# Patient Record
Sex: Male | Born: 1955 | Race: Black or African American | Hispanic: No | Smoking: Never smoker
Health system: Southern US, Community
[De-identification: ages and names within clinical notes are randomized; demographics above are authoritative.]

## PROBLEM LIST (undated history)

## (undated) DIAGNOSIS — I10 Essential (primary) hypertension: Secondary | ICD-10-CM

## (undated) DIAGNOSIS — E119 Type 2 diabetes mellitus without complications: Secondary | ICD-10-CM

---

## 2016-02-09 ENCOUNTER — Other Ambulatory Visit: Payer: Self-pay

## 2016-02-09 ENCOUNTER — Ambulatory Visit (INDEPENDENT_AMBULATORY_CARE_PROVIDER_SITE_OTHER): Payer: BLUE CROSS/BLUE SHIELD | Admitting: Podiatry

## 2016-02-09 ENCOUNTER — Inpatient Hospital Stay: Admission: RE | Admit: 2016-02-09 | Payer: Self-pay | Source: Ambulatory Visit

## 2016-02-09 ENCOUNTER — Encounter: Payer: Self-pay | Admitting: Podiatry

## 2016-02-09 VITALS — BP 138/100 | HR 68 | Ht 74.0 in | Wt 232.0 lb

## 2016-02-09 DIAGNOSIS — M216X2 Other acquired deformities of left foot: Principal | ICD-10-CM

## 2016-02-09 DIAGNOSIS — M216X9 Other acquired deformities of unspecified foot: Secondary | ICD-10-CM

## 2016-02-09 DIAGNOSIS — M216X1 Other acquired deformities of right foot: Secondary | ICD-10-CM | POA: Insufficient documentation

## 2016-02-09 DIAGNOSIS — M21969 Unspecified acquired deformity of unspecified lower leg: Secondary | ICD-10-CM | POA: Diagnosis not present

## 2016-02-09 NOTE — Patient Instructions (Signed)
Seen for custom orthotics prepared. Noted of short and elevated first metatarsal bone. Orthotics are the first line of choice for this condition. Will call when orthotics are ready.

## 2016-02-09 NOTE — Progress Notes (Signed)
SUBJECTIVE: 60 y.o. year old male presents requesting shoe inserts. Been wearing two pairs of socks, feet are affecting knees.   REVIEW OF SYSTEMS: A comprehensive review of systems was negative.  OBJECTIVE: DERMATOLOGIC EXAMINATION: No abnormal findings.  VASCULAR EXAMINATION OF LOWER LIMBS: Pedal pulses: All pedal pulses are palpable with normal pulsation.  Temperature gradient from tibial crest to dorsum of foot is within normal bilateral.  NEUROLOGIC EXAMINATION OF THE LOWER LIMBS: Achilles DTR is present and within normal. Monofilament (Semmes-Weinstein 10-gm) sensory testing positive 6 out of 6, bilateral. Vibratory sensations(128Hz  turning fork) intact at medial and lateral forefoot bilateral.  Sharp and Dull discriminatory sensations at the plantar ball of hallux is intact bilateral.   MUSCULOSKELETAL EXAMINATION: Positive for forefoot varus, elevated first ray, abducted forefoot bilateral.   RADIOGRAPHIC STUDIES:  AP View:  Short first metatarsal bone bilateral. Increased sclerotic changes with flattened first metatarsal head bilateral.  Abducted midtarsal joints bilateral.  Lateral view:  Pronated/ foot with midtarsal sagging  Severe elevated first ray L>R.  Decreased calcaneal inclination angle bilateral: Talar head plantar advancement bilateral.: Anterior break in CYMA line bilateral.  ASSESSMENT: Forefoot varus bilateral. Hypermobile first ray bilateral. Short first ray bilateral. STJ hyperpronation bilateral.  PLAN: Reviewed clinical findings, x-ray findings, and available treatment options. Both feet casted for Orthotics.

## 2016-04-13 ENCOUNTER — Ambulatory Visit: Payer: BLUE CROSS/BLUE SHIELD | Admitting: Podiatry

## 2016-04-26 ENCOUNTER — Encounter: Payer: Self-pay | Admitting: Podiatry

## 2016-04-26 ENCOUNTER — Ambulatory Visit (INDEPENDENT_AMBULATORY_CARE_PROVIDER_SITE_OTHER): Payer: BLUE CROSS/BLUE SHIELD | Admitting: Podiatry

## 2016-04-26 DIAGNOSIS — M21969 Unspecified acquired deformity of unspecified lower leg: Secondary | ICD-10-CM

## 2016-04-26 DIAGNOSIS — M216X1 Other acquired deformities of right foot: Secondary | ICD-10-CM

## 2016-04-26 DIAGNOSIS — M216X2 Other acquired deformities of left foot: Secondary | ICD-10-CM

## 2016-04-26 NOTE — Progress Notes (Signed)
1 month orthotic check. On feet long hours. Orthotics are doing well.  Reviewed proper shoe gear.  Will return for 2nd pair orthotics.

## 2016-04-26 NOTE — Patient Instructions (Signed)
Orthotic check up. Doing well. Reviewed proper shoe selection.  Return as needed or for 2nd pair orthotics.

## 2016-09-19 ENCOUNTER — Emergency Department (HOSPITAL_BASED_OUTPATIENT_CLINIC_OR_DEPARTMENT_OTHER): Payer: BLUE CROSS/BLUE SHIELD

## 2016-09-19 ENCOUNTER — Emergency Department (HOSPITAL_BASED_OUTPATIENT_CLINIC_OR_DEPARTMENT_OTHER)
Admission: EM | Admit: 2016-09-19 | Discharge: 2016-09-20 | Disposition: A | Discharge status: Home / Self Care | Payer: BLUE CROSS/BLUE SHIELD | Attending: Emergency Medicine | Admitting: Emergency Medicine

## 2016-09-19 ENCOUNTER — Encounter (HOSPITAL_BASED_OUTPATIENT_CLINIC_OR_DEPARTMENT_OTHER): Payer: Self-pay | Admitting: *Deleted

## 2016-09-19 DIAGNOSIS — J181 Lobar pneumonia, unspecified organism: Secondary | ICD-10-CM | POA: Insufficient documentation

## 2016-09-19 DIAGNOSIS — J189 Pneumonia, unspecified organism: Secondary | ICD-10-CM

## 2016-09-19 DIAGNOSIS — R05 Cough: Secondary | ICD-10-CM | POA: Diagnosis present

## 2016-09-19 LAB — CBG MONITORING, ED: GLUCOSE-CAPILLARY: 102 mg/dL — AB (ref 65–99)

## 2016-09-19 NOTE — ED Provider Notes (Signed)
[facility]   CSN: [id number] Arrival date & time: 09/19/16  2208   By signing my name below, I, Nelwyn SalisburyJoshua Fowler, attest that this documentation has been prepared under the direction and in the presence of Glynn OctaveStephen Samaad Hashem, MD . Electronically Signed: Nelwyn SalisburyJoshua Fowler, Scribe. 09/19/2016. 11:01 PM.  History   Chief Complaint Chief Complaint  Patient presents with  . URI   The history is provided by the patient. No language interpreter was used.    HPI Comments:  Cody Evans is a 61 y.o. male with pmhx of DM and HTN who presents to the Emergency Department complaining of unchanged, frequent, productive cough onset 1 week. No modifying factors indicated. Pt reports associated fatigue, rhinorrhea, and nasal congestion. He has tried tumeric and garlic at home for his symptoms with no relief. Pt denies any fever, vomiting, diarrhea or chills. No chest pain or abdominal pain. His p/o intake has been normal. Pt states that he has an extensive amount of sick contacts including family and friends. He is non-compliant with his prescribed metformin.   History reviewed. No pertinent past medical history.  Patient Active Problem List   Diagnosis Date Noted  . 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 No pertinent surgical history.     Home Medications    Prior to Admission medications   Medication Sig Start Date End Date Taking? Authorizing Provider  brimonidine (ALPHAGAN) 0.2 % ophthalmic solution  01/19/16   Historical Provider, MD  Doxepin HCl 5 % CREA  12/29/15   Historical Provider, MD  latanoprost (XALATAN) 0.005 % ophthalmic solution  12/26/15   Historical Provider, MD  lidocaine (XYLOCAINE) 5 % ointment  01/31/16   Historical Provider, MD  losartan-hydrochlorothiazide Mauri Reading(HYZAAR) 100-25 MG tablet  01/14/16   Historical Provider, MD    Family History History reviewed. No pertinent family history.  Social History Social History  Substance  Use Topics  . Smoking status: Never Smoker  . Smokeless tobacco: Never Used  . Alcohol use No     Allergies   Patient has no known allergies.   Review of Systems Review of Systems  Constitutional: Positive for fatigue. Negative for chills and fever.  HENT: Positive for congestion and rhinorrhea.   Respiratory: Positive for cough.   Gastrointestinal: Negative for diarrhea and vomiting.  Neurological: Positive for numbness.  All other systems reviewed and are negative.    Physical Exam Updated Vital Signs BP (!) 134/102 (BP Location: Left Arm) Comment: pt non-compliant with BP meds  Pulse 90   Temp 98 F (36.7 C) (Oral)   Resp 18   Ht 6\' 2"  (1.88 m)   Wt 230 lb (104.3 kg)   SpO2 100%   BMI 29.53 kg/m   Physical Exam  Constitutional: He is oriented to person, place, and time. He appears well-developed and well-nourished. No distress.  HENT:  Head: Normocephalic and atraumatic.  Mouth/Throat: Oropharynx is clear and moist. No oropharyngeal exudate.  Mild frontal sinus tenderness.   Eyes: Conjunctivae and EOM are normal. Pupils are equal, round, and reactive to light.  Neck: Normal range of motion. Neck supple.  No meningismus.  Cardiovascular: Normal rate, regular rhythm, normal heart sounds and intact distal pulses.   No murmur heard. Pulmonary/Chest: Effort normal and breath sounds normal. No respiratory distress. He has no wheezes.  Abdominal: Soft. He exhibits no distension. There is no tenderness. There is no rebound and no guarding.  Musculoskeletal: Normal range of motion. He exhibits  no edema or tenderness.  Neurological: He is alert and oriented to person, place, and time. No cranial nerve deficit. He exhibits normal muscle tone. Coordination normal.   5/5 strength throughout. CN 2-12 intact.Equal grip strength.   Skin: Skin is warm and dry.  Psychiatric: He has a normal mood and affect. His behavior is normal.  Nursing note and vitals reviewed.    ED  Treatments / Results  DIAGNOSTIC STUDIES:  Oxygen Saturation is 100% on RA, normal by my interpretation.    COORDINATION OF CARE:  11:06 PM Discussed treatment plan with pt at bedside which includes blood work and pt agreed to plan.  Labs (all labs ordered are listed, but only abnormal results are displayed) Labs Reviewed - No data to display  EKG  EKG Interpretation None       Radiology Dg Chest 2 View  Result Date: 09/19/2016 CLINICAL DATA:  Cough, fatigue, nasal congestion for 1 week. EXAM: CHEST  2 VIEW COMPARISON:  None. FINDINGS: LEFT lung base consolidation seen on the AP view, possibly within the lower lobe. No pleural effusion. Cardiomediastinal silhouette is normal. Mildly calcified aortic knob. No pneumothorax. Soft tissue planes and included osseous structures are nonsuspicious, mild degenerative change of the thoracic spine. IMPRESSION: LEFT lung base pneumonia. Followup PA and lateral chest X-ray is recommended in 3-4 weeks following trial of antibiotic therapy to ensure resolution and exclude underlying malignancy. Electronically Signed   By: Awilda Metro M.D.   On: 09/19/2016 23:52    Procedures Procedures (including critical care time)  Medications Ordered in ED Medications - No data to display   Initial Impression / Assessment and Plan / ED Course  I have reviewed the triage vital signs and the nursing notes.  Pertinent labs & imaging results that were available during my care of the patient were reviewed by me and considered in my medical decision making (see chart for details).     Noncompliant with hypertensive and diabetes medications. Presents with one-week history of cough and nasal congestion. Denies chest pain. Denies fever. No distress. lungs are clear. CBG 102.  X-ray shows possible left basilar pneumonia. We'll treat. Patient well-appearing and not hypoxic with ambulation. Tolerating by mouth. Discussed with patient he needs x-ray follow-up  to ensure resolution and rule out malignancy. Return precautions discussed.  CURB 65 zero points.     Final Clinical Impressions(s) / ED Diagnoses   Final diagnoses:  Community acquired pneumonia of left lung, unspecified part of lung    New Prescriptions New Prescriptions   No medications on file  I personally performed the services described in this documentation, which was scribed in my presence. The recorded information has been reviewed and is accurate.     Glynn Octave, MD 09/20/16 0120

## 2016-09-19 NOTE — ED Triage Notes (Signed)
Cough and nasal congestion x 1 week.  Denies fever

## 2016-09-20 MED ORDER — BENZONATATE 100 MG PO CAPS: 100.0000 mg | ORAL_CAPSULE | Freq: Three times a day (TID) | ORAL | 0 refills | Status: DC

## 2016-09-20 MED ORDER — DOXYCYCLINE HYCLATE 100 MG PO CAPS: 100.0000 mg | ORAL_CAPSULE | Freq: Two times a day (BID) | ORAL | 0 refills | Status: DC

## 2016-09-20 NOTE — ED Notes (Signed)
Pt ambulated with no difficulties, o2 sat 98%, p-102

## 2016-09-20 NOTE — Discharge Instructions (Signed)
Take the antibiotics as prescribed.  Follow up with your doctor. Return to the ED if you develop new or worsening symptoms. °

## 2018-04-16 IMAGING — DX DG CHEST 2V
2 series · 2 of 2 positions shown · non-contrast
Comparison: None.

CLINICAL DATA: Cough, fatigue, nasal congestion for 1 week.

EXAM:
CHEST  2 VIEW

[chest pa]
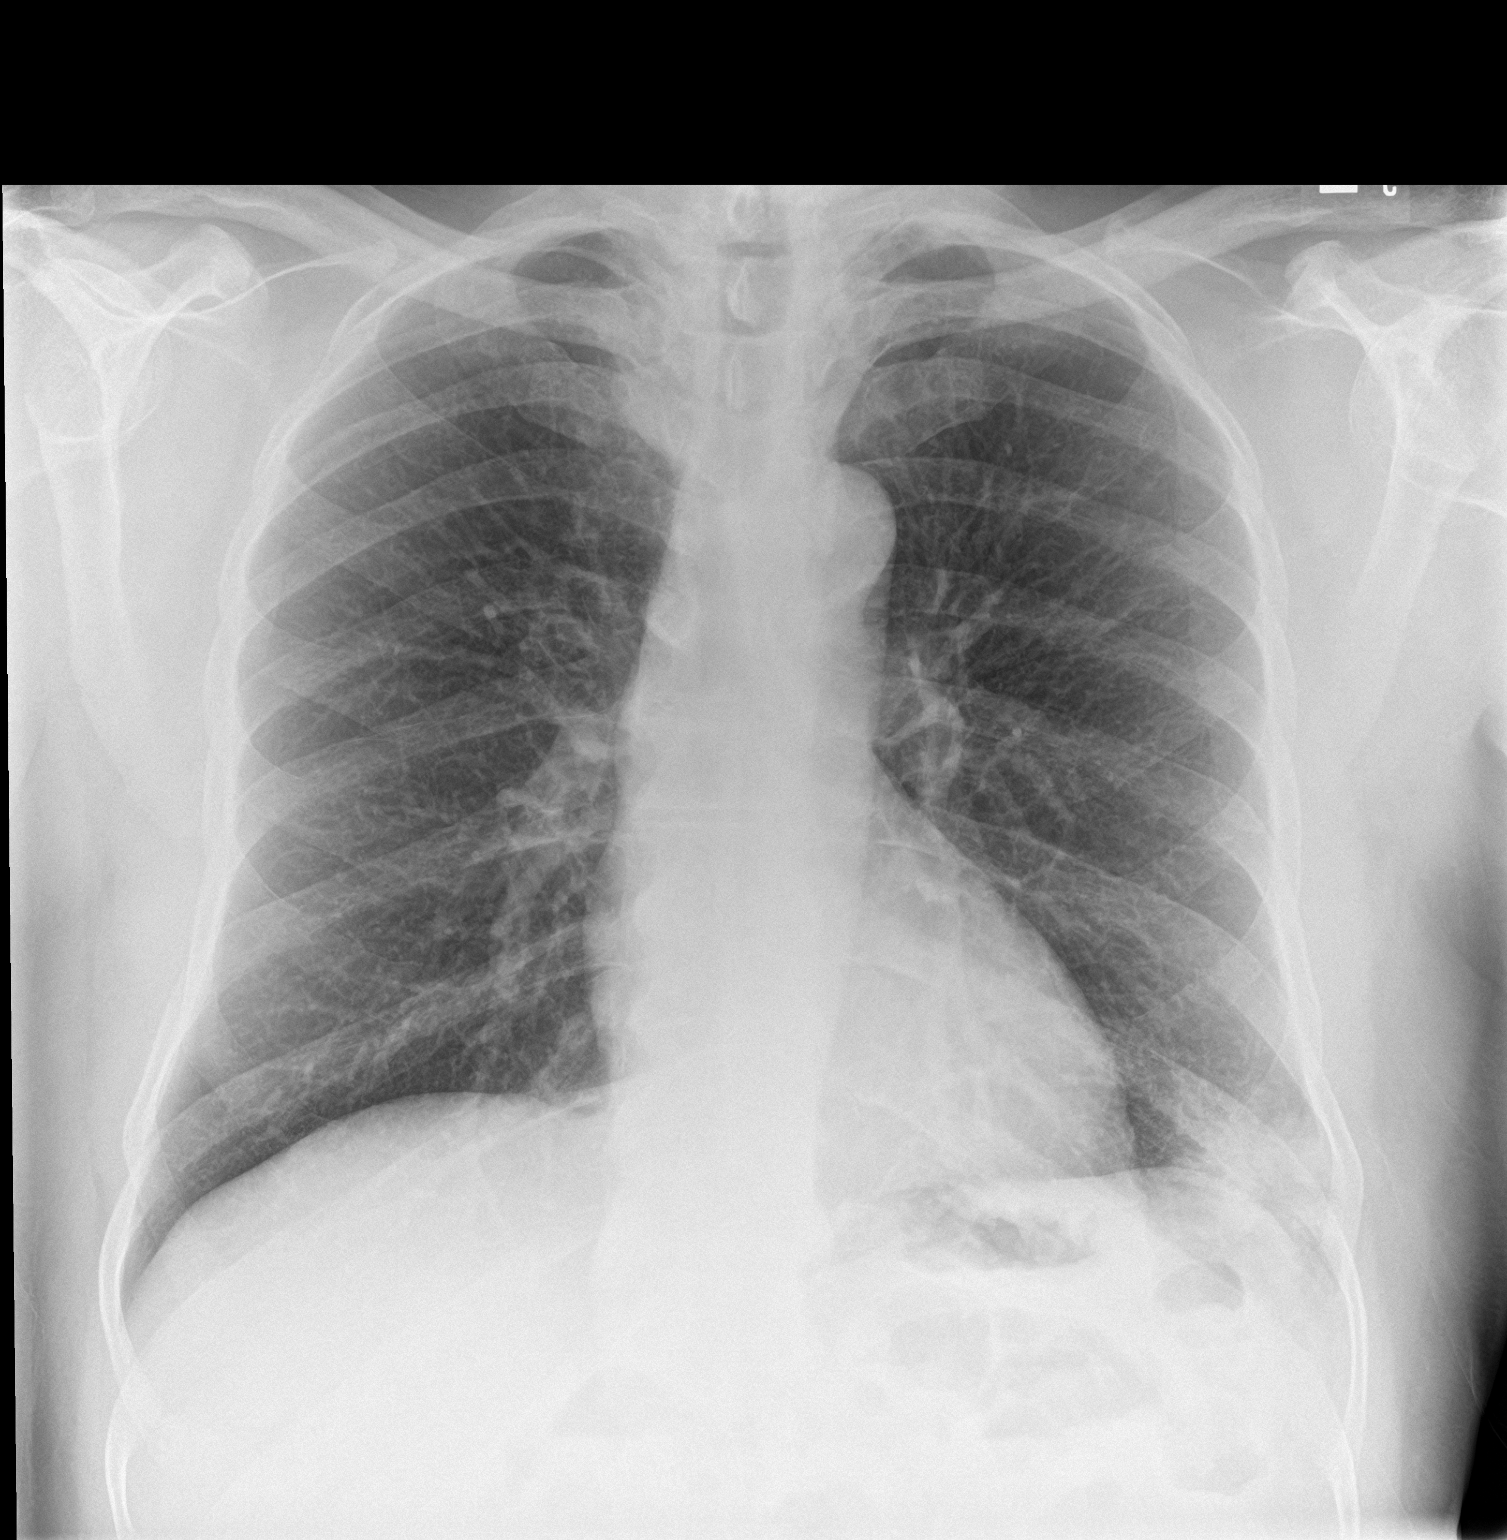

[chest lat]
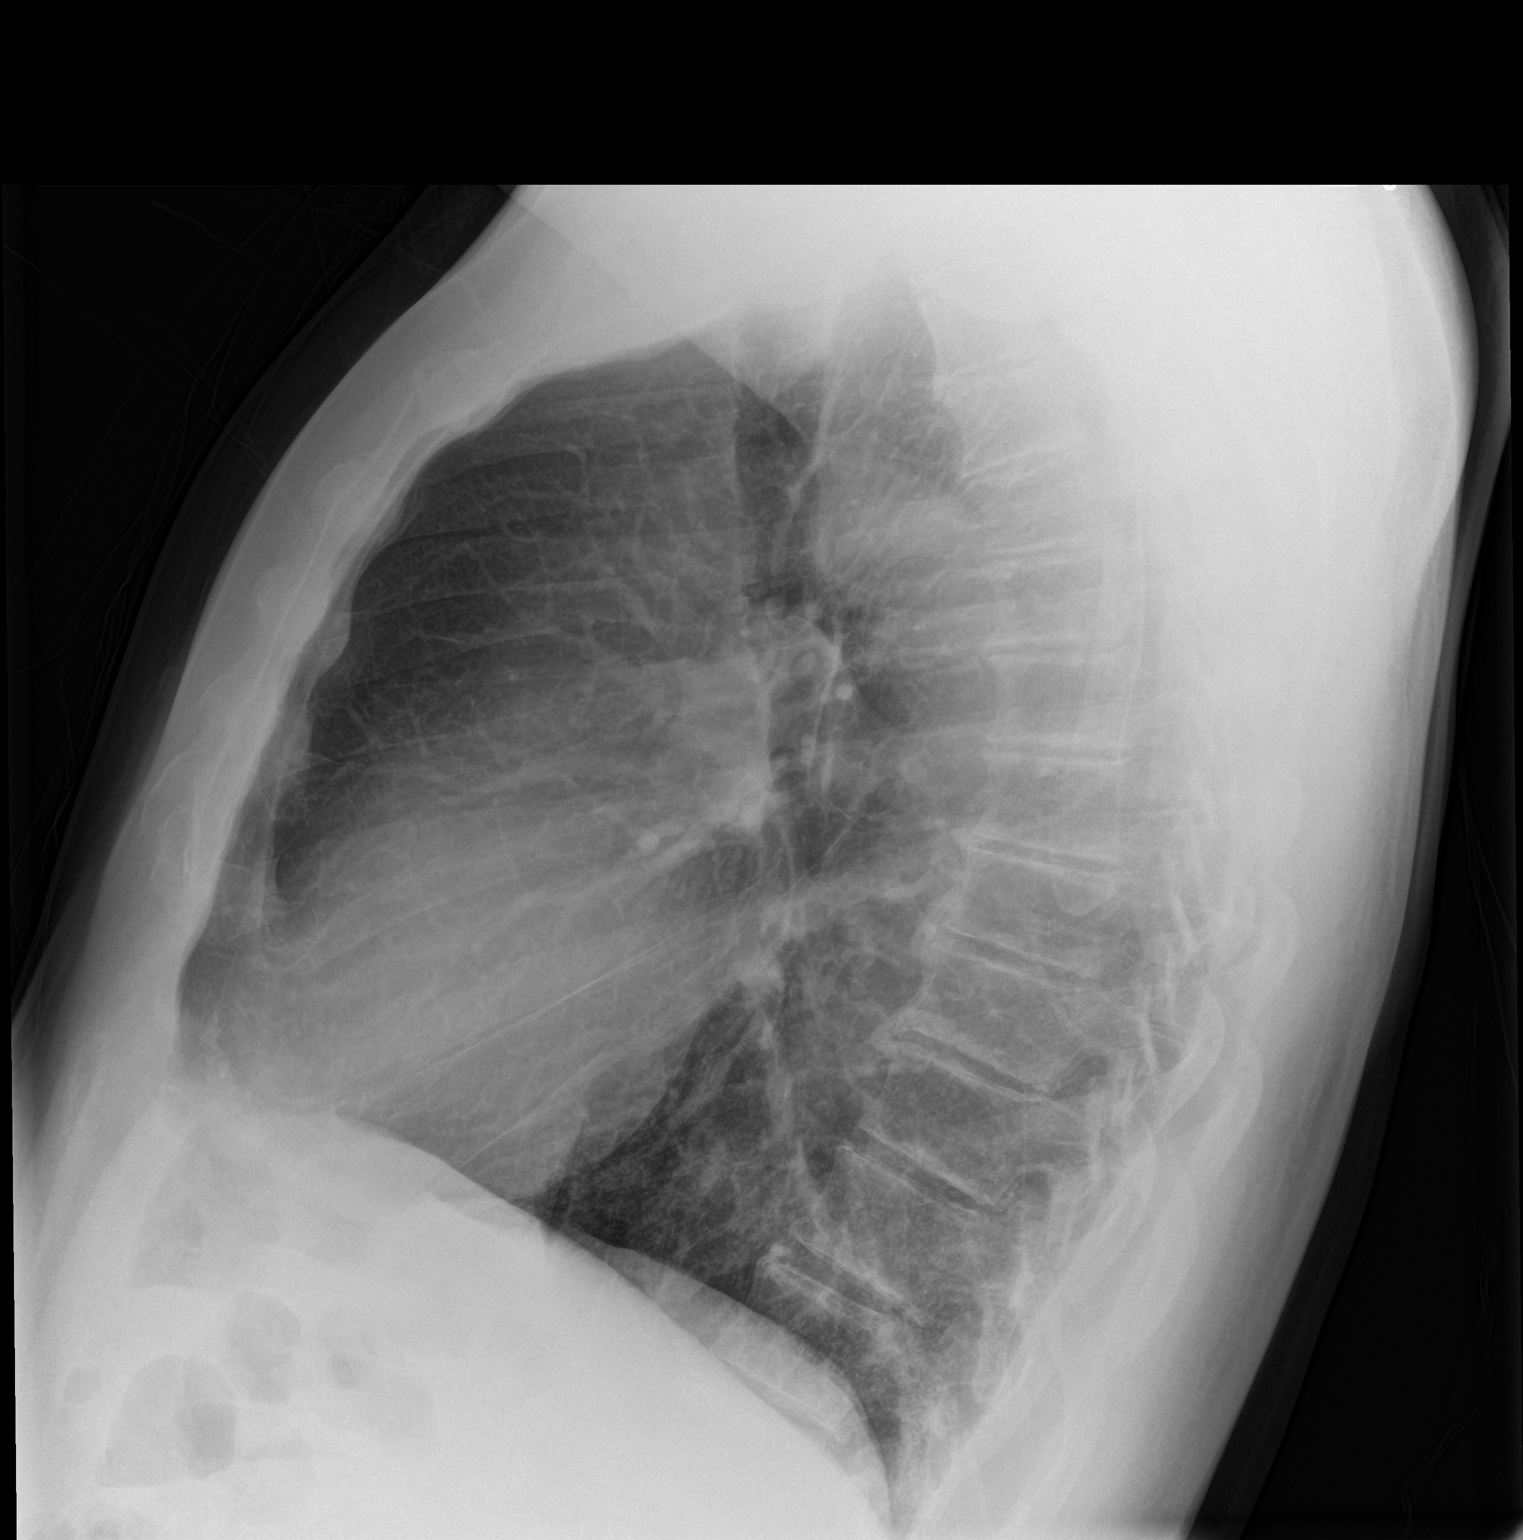

[2 of 2 positions shown; findings below may reference images not displayed]

FINDINGS: LEFT lung base consolidation seen on the AP view, possibly within
the lower lobe. No pleural effusion. Cardiomediastinal silhouette is
normal. Mildly calcified aortic knob. No pneumothorax. Soft tissue
planes and included osseous structures are nonsuspicious, mild
degenerative change of the thoracic spine.
IMPRESSION: LEFT lung base pneumonia. Followup PA and lateral chest X-ray is
recommended in 3-4 weeks following trial of antibiotic therapy to
ensure resolution and exclude underlying malignancy.

## 2020-07-24 ENCOUNTER — Encounter (HOSPITAL_BASED_OUTPATIENT_CLINIC_OR_DEPARTMENT_OTHER): Payer: Self-pay | Admitting: Emergency Medicine

## 2020-07-24 ENCOUNTER — Emergency Department (HOSPITAL_BASED_OUTPATIENT_CLINIC_OR_DEPARTMENT_OTHER)
Admission: EM | Admit: 2020-07-24 | Discharge: 2020-07-24 | Disposition: A | Discharge status: Home / Self Care | Payer: HRSA Program | Attending: Emergency Medicine | Admitting: Emergency Medicine

## 2020-07-24 ENCOUNTER — Emergency Department (HOSPITAL_BASED_OUTPATIENT_CLINIC_OR_DEPARTMENT_OTHER): Payer: HRSA Program

## 2020-07-24 ENCOUNTER — Other Ambulatory Visit: Payer: Self-pay

## 2020-07-24 DIAGNOSIS — E119 Type 2 diabetes mellitus without complications: Secondary | ICD-10-CM | POA: Diagnosis not present

## 2020-07-24 DIAGNOSIS — U071 COVID-19: Secondary | ICD-10-CM | POA: Diagnosis not present

## 2020-07-24 DIAGNOSIS — R059 Cough, unspecified: Secondary | ICD-10-CM | POA: Diagnosis present

## 2020-07-24 DIAGNOSIS — Z79899 Other long term (current) drug therapy: Secondary | ICD-10-CM | POA: Insufficient documentation

## 2020-07-24 DIAGNOSIS — I10 Essential (primary) hypertension: Secondary | ICD-10-CM | POA: Insufficient documentation

## 2020-07-24 HISTORY — DX: Type 2 diabetes mellitus without complications: E11.9

## 2020-07-24 HISTORY — DX: Essential (primary) hypertension: I10

## 2020-07-24 LAB — URINALYSIS, MICROSCOPIC (REFLEX)

## 2020-07-24 LAB — COMPREHENSIVE METABOLIC PANEL
ALT: 30 U/L (ref 0–44)
AST: 41 U/L (ref 15–41)
Albumin: 4.8 g/dL (ref 3.5–5.0)
Alkaline Phosphatase: 63 U/L (ref 38–126)
Anion gap: 12 (ref 5–15)
BUN: 21 mg/dL (ref 8–23)
CO2: 19 mmol/L — ABNORMAL LOW (ref 22–32)
Calcium: 8.9 mg/dL (ref 8.9–10.3)
Chloride: 102 mmol/L (ref 98–111)
Creatinine, Ser: 1.23 mg/dL (ref 0.61–1.24)
GFR, Estimated: >60 mL/min (ref >60)
Glucose, Bld: 148 mg/dL — ABNORMAL HIGH (ref 70–99)
Potassium: 3.6 mmol/L (ref 3.5–5.1)
Sodium: 133 mmol/L — ABNORMAL LOW (ref 135–145)
Total Bilirubin: 1.3 mg/dL — ABNORMAL HIGH (ref 0.3–1.2)
Total Protein: 7.7 g/dL (ref 6.5–8.1)

## 2020-07-24 LAB — RESP PANEL BY RT-PCR (FLU A&B, COVID) ARPGX2
Influenza A by PCR: NEGATIVE
Influenza B by PCR: NEGATIVE
SARS Coronavirus 2 by RT PCR: POSITIVE — AB

## 2020-07-24 LAB — URINALYSIS, ROUTINE W REFLEX MICROSCOPIC
Bilirubin Urine: NEGATIVE
Glucose, UA: NEGATIVE mg/dL
Hgb urine dipstick: NEGATIVE
Ketones, ur: NEGATIVE mg/dL
Leukocytes,Ua: NEGATIVE
Nitrite: NEGATIVE
Protein, ur: 30 mg/dL — AB
Specific Gravity, Urine: >1.03 — ABNORMAL HIGH (ref 1.005–1.030)
pH: 5.5 (ref 5.0–8.0)

## 2020-07-24 LAB — CBC
HCT: 51.2 % (ref 39.0–52.0)
Hemoglobin: 17 g/dL (ref 13.0–17.0)
MCH: 28.9 pg (ref 26.0–34.0)
MCHC: 33.2 g/dL (ref 30.0–36.0)
MCV: 86.9 fL (ref 80.0–100.0)
Platelets: 105 10*3/uL — ABNORMAL LOW (ref 150–400)
RBC: 5.89 MIL/uL — ABNORMAL HIGH (ref 4.22–5.81)
RDW: 12.8 % (ref 11.5–15.5)
WBC: 3.1 10*3/uL — ABNORMAL LOW (ref 4.0–10.5)
nRBC: 0 % (ref 0.0–0.2)

## 2020-07-24 LAB — LIPASE, BLOOD: Lipase: 19 U/L (ref 11–51)

## 2020-07-24 MED ORDER — METHYLPREDNISOLONE SODIUM SUCC 125 MG IJ SOLR: 125.0000 mg | Freq: Once | INTRAMUSCULAR | Status: DC | PRN

## 2020-07-24 MED ORDER — ALBUTEROL SULFATE HFA 108 (90 BASE) MCG/ACT IN AERS: 2.0000 | INHALATION_SPRAY | Freq: Once | RESPIRATORY_TRACT | Status: DC | PRN

## 2020-07-24 MED ORDER — CASIRIVIMAB-IMDEVIMAB 600-600 MG/10ML IJ SOLN: 1200.0000 mg | Freq: Once | INTRAMUSCULAR | Status: DC

## 2020-07-24 MED ORDER — FAMOTIDINE IN NACL 20-0.9 MG/50ML-% IV SOLN: 20.0000 mg | Freq: Once | INTRAVENOUS | Status: DC | PRN

## 2020-07-24 MED ORDER — SODIUM CHLORIDE 0.9 % IV SOLN: INTRAVENOUS | Status: DC | PRN

## 2020-07-24 MED ORDER — EPINEPHRINE 0.3 MG/0.3ML IJ SOAJ: 0.3000 mg | Freq: Once | INTRAMUSCULAR | Status: DC | PRN

## 2020-07-24 MED ORDER — ACETAMINOPHEN 500 MG PO TABS
1000.0000 mg | ORAL_TABLET | Freq: Once | ORAL | Status: AC
  Administered 2020-07-24: 1000 mg via ORAL
  Filled 2020-07-24: qty 2

## 2020-07-24 MED ORDER — DIPHENHYDRAMINE HCL 50 MG/ML IJ SOLN
50.0000 mg | Freq: Once | INTRAMUSCULAR | Status: DC | PRN
Start: 2020-07-24 — End: 2020-07-25

## 2020-07-24 MED ORDER — SODIUM CHLORIDE 0.9 % IV BOLUS: 500.0000 mL | Freq: Once | INTRAVENOUS | Status: DC

## 2020-07-24 NOTE — Discharge Instructions (Addendum)
Person Under Monitoring Name: Cody Evans  Location: [address] [place] [address]   Infection Prevention Recommendations for Individuals Confirmed to have, or Being Evaluated for, 2019 Novel Coronavirus (COVID-19) Infection Who Receive Care at Home  Individuals who are confirmed to have, or are being evaluated for, COVID-19 should follow the prevention steps below until a healthcare provider or local or state health department says they can return to normal activities.  Stay home except to get medical care You should restrict activities outside your home, except for getting medical care. Do not go to work, school, or public areas, and do not use public transportation or taxis.  Call ahead before visiting your doctor Before your medical appointment, call the healthcare provider and tell them that you have, or are being evaluated for, COVID-19 infection. This will help the healthcare provider's office take steps to keep other people from getting infected. Ask your healthcare provider to call the local or state health department.  Monitor your symptoms Seek prompt medical attention if your illness is worsening (e.g., difficulty breathing). Before going to your medical appointment, call the healthcare provider and tell them that you have, or are being evaluated for, COVID-19 infection. Ask your healthcare provider to call the local or state health department.  Wear a facemask You should wear a facemask that covers your nose and mouth when you are in the same room with other people and when you visit a healthcare provider. People who live with or visit you should also wear a facemask while they are in the same room with you.  Separate yourself from other people in your home As much as possible, you should stay in a different room from other people in your home. Also, you should use a separate bathroom, if available.  Avoid sharing household items You should not  share dishes, drinking glasses, cups, eating utensils, towels, bedding, or other items with other people in your home. After using these items, you should wash them thoroughly with soap and water.  Cover your coughs and sneezes Cover your mouth and nose with a tissue when you cough or sneeze, or you can cough or sneeze into your sleeve. Throw used tissues in a lined trash can, and immediately wash your hands with soap and water for at least 20 seconds or use an alcohol-based hand rub.  Wash your [employer] your hands often and thoroughly with soap and water for at least 20 seconds. You can use an alcohol-based hand sanitizer if soap and water are not available and if your hands are not visibly dirty. Avoid touching your eyes, nose, and mouth with unwashed hands.   Prevention Steps for Caregivers and Household Members of Individuals Confirmed to have, or Being Evaluated for, COVID-19 Infection Being Cared for in the Home  If you live with, or provide care at home for, a person confirmed to have, or being evaluated for, COVID-19 infection please follow these guidelines to prevent infection:  Follow healthcare provider's instructions Make sure that you understand and can help the patient follow any healthcare provider instructions for all care.  Provide for the patient's basic needs You should help the patient with basic needs in the home and provide support for getting groceries, prescriptions, and other personal needs.  Monitor the patient's symptoms If they are getting sicker, call his or her medical provider and tell them that the patient has, or is being evaluated for, COVID-19 infection. This will help the healthcare provider's office  take steps to keep other people from getting infected. Ask the healthcare provider to call the local or state health department.  Limit the number of people who have contact with the patient If possible, have only one caregiver for the  patient. Other household members should stay in another home or place of residence. If this is not possible, they should stay in another room, or be separated from the patient as much as possible. Use a separate bathroom, if available. Restrict visitors who do not have an essential need to be in the home.  Keep older adults, very young children, and other sick people away from the patient Keep older adults, very young children, and those who have compromised immune systems or chronic health conditions away from the patient. This includes people with chronic heart, lung, or kidney conditions, diabetes, and cancer.  Ensure good ventilation Make sure that shared spaces in the home have good air flow, such as from an air conditioner or an opened window, weather permitting.  Wash your hands often Wash your hands often and thoroughly with soap and water for at least 20 seconds. You can use an alcohol based hand sanitizer if soap and water are not available and if your hands are not visibly dirty. Avoid touching your eyes, nose, and mouth with unwashed hands. Use disposable paper towels to dry your hands. If not available, use dedicated cloth towels and replace them when they become wet.  Wear a facemask and gloves Wear a disposable facemask at all times in the room and gloves when you touch or have contact with the patient's blood, body fluids, and/or secretions or excretions, such as sweat, saliva, sputum, nasal mucus, vomit, urine, or feces.  Ensure the mask fits over your nose and mouth tightly, and do not touch it during use. Throw out disposable facemasks and gloves after using them. Do not reuse. Wash your hands immediately after removing your facemask and gloves. If your personal clothing becomes contaminated, carefully remove clothing and launder. Wash your hands after handling contaminated clothing. Place all used disposable facemasks, gloves, and other waste in a lined container before  disposing them with other household waste. Remove gloves and wash your hands immediately after handling these items.  Do not share dishes, glasses, or other household items with the patient Avoid sharing household items. You should not share dishes, drinking glasses, cups, eating utensils, towels, bedding, or other items with a patient who is confirmed to have, or being evaluated for, COVID-19 infection. After the person uses these items, you should wash them thoroughly with soap and water.  Wash laundry thoroughly Immediately remove and wash clothes or bedding that have blood, body fluids, and/or secretions or excretions, such as sweat, saliva, sputum, nasal mucus, vomit, urine, or feces, on them. Wear gloves when handling laundry from the patient. Read and follow directions on labels of laundry or clothing items and detergent. In general, wash and dry with the warmest temperatures recommended on the label.  Clean all areas the individual has used often Clean all touchable surfaces, such as counters, tabletops, doorknobs, bathroom fixtures, toilets, phones, keyboards, tablets, and bedside tables, every day. Also, clean any surfaces that may have blood, body fluids, and/or secretions or excretions on them. Wear gloves when cleaning surfaces the patient has come in contact with. Use a diluted bleach solution (e.g., dilute bleach with 1 part bleach and 10 parts water) or a household disinfectant with a label that says EPA-registered for coronaviruses. To make a bleach  solution at home, add 1 tablespoon of bleach to 1 quart (4 cups) of water. For a larger supply, add  cup of bleach to 1 gallon (16 cups) of water. Read labels of cleaning products and follow recommendations provided on product labels. Labels contain instructions for safe and effective use of the cleaning product including precautions you should take when applying the product, such as wearing gloves or eye protection and making sure you  have good ventilation during use of the product. Remove gloves and wash hands immediately after cleaning.  Monitor yourself for signs and symptoms of illness Caregivers and household members are considered close contacts, should monitor their health, and will be asked to limit movement outside of the home to the extent possible. Follow the monitoring steps for close contacts listed on the symptom monitoring form.   ? If you have additional questions, contact your local health department or call the epidemiologist on call at [phone] (available 24/7). ? This guidance is subject to change. For the most up-to-date guidance from [facility], please refer to their website: YouBlogs.pl

## 2020-07-24 NOTE — ED Triage Notes (Signed)
Pt c/o decreased appetite with fatigue x 1 week. Pt denies CP, denies fever. Endorses diarrhea today. GCS 15

## 2020-07-24 NOTE — ED Provider Notes (Signed)
[facility] EMERGENCY DEPARTMENT Provider Note   CSN: [id number] Arrival date & time: 07/24/20  1652     History Chief Complaint  Patient presents with  . Fatigue  . Diarrhea    Cody Evans is a 64 y.o. male.  Pt presents to the ED today with fatigue, cough, and diarrhea.  Sx have been going on for 1 week.  He has not been vaccinated against Covid.  Pt denies fevers.  He is not sob.         Past Medical History:  Diagnosis Date  . Diabetes mellitus without complication (HCC)   . Hypertension     Patient Active Problem List   Diagnosis Date Noted  . 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 No pertinent surgical history.     History reviewed. No pertinent family history.  Social History   Tobacco Use  . Smoking status: Never Smoker  . Smokeless tobacco: Never Used  Substance Use Topics  . Alcohol use: No    Alcohol/week: 0.0 standard drinks  . Drug use: No    Home Medications Prior to Admission medications   Medication Sig Start Date End Date Taking? Authorizing Provider  benzonatate (TESSALON) 100 MG capsule Take 1 capsule (100 mg total) by mouth every 8 (eight) hours. 09/20/16   Rancour, Jeannett Senior, MD  brimonidine [facility]) 0.2 % ophthalmic solution  01/19/16   [provider]  Doxepin HCl 5 % CREA  12/29/15   [provider]  doxycycline (VIBRAMYCIN) 100 MG capsule Take 1 capsule (100 mg total) by mouth 2 (two) times daily. 09/20/16   Rancour, Jeannett Senior, MD  latanoprost (XALATAN) 0.005 % ophthalmic solution  12/26/15   [provider]  lidocaine (XYLOCAINE) 5 % ointment  01/31/16   [provider]  losartan-hydrochlorothiazide [facility]) 100-25 MG tablet  01/14/16   [provider]    Allergies    Patient has no known allergies.  Review of Systems   Review of Systems  Constitutional: Positive for fatigue.  Respiratory: Positive for cough.   Gastrointestinal: Positive for  diarrhea.    Physical Exam Updated Vital Signs BP (!) 173/103   Pulse 92   Temp 99.8 F (37.7 C) (Oral)   Resp 17   Ht 6\' 2"  (1.88 m)   Wt 99.8 kg   SpO2 98%   BMI 28.25 kg/m   Physical Exam Vitals and nursing note reviewed.  Constitutional:      Appearance: Normal appearance.  HENT:     Head: Normocephalic and atraumatic.     Right Ear: External ear normal.     Left Ear: External ear normal.     Nose: Nose normal.     Mouth/Throat:     Mouth: Mucous membranes are dry.  Eyes:     Extraocular Movements: Extraocular movements intact.     Conjunctiva/sclera: Conjunctivae normal.     Pupils: Pupils are equal, round, and reactive to light.  Cardiovascular:     Rate and Rhythm: Normal rate and regular rhythm.     Pulses: Normal pulses.     Heart sounds: Normal heart sounds.  Pulmonary:     Effort: Pulmonary effort is normal.     Breath sounds: Normal breath sounds.  Abdominal:     General: Abdomen is flat. Bowel sounds are normal.     Palpations: Abdomen is soft.  Musculoskeletal:        General: Normal range of motion.     Cervical back:  Normal range of motion and neck supple.  Skin:    General: Skin is warm.     Capillary Refill: Capillary refill takes less than 2 seconds.  Neurological:     General: No focal deficit present.     Mental Status: He is alert and oriented to person, place, and time.  Psychiatric:        Mood and Affect: Mood normal.        Behavior: Behavior normal.     ED Results / Procedures / Treatments   Labs (all labs ordered are listed, but only abnormal results are displayed) Labs Reviewed  RESP PANEL BY RT-PCR (FLU A&B, COVID) ARPGX2 - Abnormal; Notable for the following components:      Result Value   SARS Coronavirus 2 by RT PCR POSITIVE (*)    All other components within normal limits  COMPREHENSIVE METABOLIC PANEL - Abnormal; Notable for the following components:   Sodium 133 (*)    CO2 19 (*)    Glucose, Bld 148 (*)    Total  Bilirubin 1.3 (*)    All other components within normal limits  CBC - Abnormal; Notable for the following components:   WBC 3.1 (*)    RBC 5.89 (*)    Platelets 105 (*)    All other components within normal limits  URINALYSIS, ROUTINE W REFLEX MICROSCOPIC - Abnormal; Notable for the following components:   Specific Gravity, Urine >1.030 (*)    Protein, ur 30 (*)    All other components within normal limits  URINALYSIS, MICROSCOPIC (REFLEX) - Abnormal; Notable for the following components:   Bacteria, UA MANY (*)    All other components within normal limits  LIPASE, BLOOD    EKG None  Radiology DG Chest Portable 1 View  Result Date: 07/24/2020 CLINICAL DATA:  COVID-19 positive, fatigue EXAM: PORTABLE CHEST 1 VIEW COMPARISON:  09/19/2016 FINDINGS: The heart size and mediastinal contours are within normal limits. Both lungs are clear. The visualized skeletal structures are unremarkable. IMPRESSION: No active disease. Electronically Signed   By: Sharlet Salina M.D.   On: 07/24/2020 21:31    Procedures Procedures (including critical care time)  Medications Ordered in ED Medications  sodium chloride 0.9 % bolus 500 mL (500 mLs Intravenous Not Given 07/24/20 2153)  casirivimab-imdevimab (REGEN-COV) 1,200 mg in sodium chloride 0.9 % 110 mL IVPB (1,200 mg Intravenous Not Given 07/24/20 2153)  0.9 %  sodium chloride infusion (has no administration in time range)  diphenhydrAMINE (BENADRYL) injection 50 mg (has no administration in time range)  famotidine (PEPCID) IVPB 20 mg premix (has no administration in time range)  methylPREDNISolone sodium succinate (SOLU-MEDROL) 125 mg/2 mL injection 125 mg (has no administration in time range)  albuterol (VENTOLIN HFA) 108 (90 Base) MCG/ACT inhaler 2 puff (has no administration in time range)  EPINEPHrine (EPI-PEN) injection 0.3 mg (has no administration in time range)  acetaminophen (TYLENOL) tablet 1,000 mg (1,000 mg Oral Given 07/24/20 2150)     ED Course  I have reviewed the triage vital signs and the nursing notes.  Pertinent labs & imaging results that were available during my care of the patient were reviewed by me and considered in my medical decision making (see chart for details).    MDM Rules/Calculators/A&P                          Pt is positive for Covid.  He meets criteria for the mab infusion  for dm, htn, bmi, and age.  He is interested in getting this infusion.  The pt changed his mind and did not want to get the mab infusion tonight.  He wants to think about it more.  I sent a secure message to the mab infusion team.  Pt is stable for d/c.  He knows to return if worse.  Cody Evans was evaluated in Emergency Department on 07/24/2020 for the symptoms described in the history of present illness. He was evaluated in the context of the global COVID-19 pandemic, which necessitated consideration that the patient might be at risk for infection with the SARS-CoV-2 virus that causes COVID-19. Institutional protocols and algorithms that pertain to the evaluation of patients at risk for COVID-19 are in a state of rapid change based on information released by regulatory bodies including the CDC and federal and state organizations. These policies and algorithms were followed during the patient's care in the ED.  Final Clinical Impression(s) / ED Diagnoses Final diagnoses:  COVID-19    Rx / DC Orders ED Discharge Orders    None       Jacalyn Lefevre, MD 07/24/20 2155

## 2020-07-24 NOTE — ED Notes (Signed)
Pt given specimen device for urine

## 2020-07-25 ENCOUNTER — Telehealth: Payer: Self-pay | Admitting: Physician Assistant

## 2020-07-25 NOTE — Telephone Encounter (Signed)
Called to discuss with patient about Covid symptoms and the use of sotrovimab, bamlanivimab/etesevimab or casirivimab/imdevimab, a monoclonal antibody infusion for those with mild to moderate Covid symptoms and at a high risk of hospitalization.  Pt is qualified for this infusion at the [facility] center due to; Specific high risk criteria : Older age (>/= 65 yo), BMI > 25, Diabetes and Cardiovascular disease or hypertension   Message left to call back our hotline [phone]. My chart message sent if active on Mychart.   Rosser Collington PA-C  

## 2021-09-29 ENCOUNTER — Ambulatory Visit: Payer: Self-pay | Admitting: Cardiovascular Disease

## 2021-10-10 NOTE — Progress Notes (Signed)
Cardiology Office Note:    Date:  10/12/2021   ID:  Cody Evans, DOB Sep 27, 1955, MRN [id number]  PCP:  Mliss Sax, MD  Cardiologist:  Little Ishikawa, MD  Electrophysiologist:  None   Referring MD: Kathaleen Bury*   Chief Complaint  Patient presents with   Palpitations    History of Present Illness:    Cody Evans is a 66 y.o. male with a hx of hypertension, T2DM who is referred by Michelle Nasuti, PA for evaluation of palpitations.  He reports that he has had 2 episodes of palpitations over the last 3 weeks.  Each lasted about 1 minute.  Occurred while he was resting and watching TV.  Felt like heart was racing.  He denies any chest pain or dyspnea.  Does report some lightheadedness recently when he had GI virus.  No syncope.  He started walking this week for 12 minutes/day.  No exertional symptoms.  No smoking history.  Family history includes mother had valve replacement, he is unsure of details.  Brother had MI in 46s    Past Medical History:  Diagnosis Date   Diabetes mellitus without complication (HCC)    Hypertension     No past surgical history on file.  Current Medications: Current Meds  Medication Sig   brimonidine (ALPHAGAN) 0.2 % ophthalmic solution    Doxepin HCl 5 % CREA    hydrochlorothiazide (HYDRODIURIL) 25 MG tablet Take 25 mg by mouth daily.   latanoprost (XALATAN) 0.005 % ophthalmic solution    lidocaine (XYLOCAINE) 5 % ointment    metoprolol succinate (TOPROL-XL) 50 MG 24 hr tablet Take 50 mg by mouth daily.   olmesartan (BENICAR) 40 MG tablet Take 1 tablet by mouth daily.     Allergies:   Patient has no known allergies.   Social History   Socioeconomic History   Marital status: Divorced    Spouse name: Not on file   Number of children: Not on file   Years of education: Not on file   Highest education level: Not on file  Occupational History   Not on file  Tobacco Use   Smoking status: Never   Smokeless  tobacco: Never  Substance and Sexual Activity   Alcohol use: No    Alcohol/week: 0.0 standard drinks   Drug use: No   Sexual activity: Not on file  Other Topics Concern   Not on file  Social History Narrative   Not on file   Social Determinants of Health   Financial Resource Strain: Not on file  Food Insecurity: Not on file  Transportation Needs: Not on file  Physical Activity: Not on file  Stress: Not on file  Social Connections: Not on file     Family History: Family history includes mother had valve replacement, he is unsure of details.  Brother had MI in 11s  ROS:   Please see the history of present illness.     All other systems reviewed and are negative.  EKGs/Labs/Other Studies Reviewed:    The following studies were reviewed today:   EKG:   10/12/21: Normal sinus rhythm, left axis deviation, nonspecific T wave flattening, rate 72  Recent Labs: No results found for requested labs within last 8760 hours.  Recent Lipid Panel No results found for: CHOL, TRIG, HDL, CHOLHDL, VLDL, LDLCALC, LDLDIRECT  Physical Exam:    VS:  BP 130/88    Pulse 72    Ht 6\' 2"  (1.88 m)    Wt 220  lb 9.6 oz (100.1 kg)    SpO2 99%    BMI 28.32 kg/m     Wt Readings from Last 3 Encounters:  10/12/21 220 lb 9.6 oz (100.1 kg)  07/24/20 220 lb (99.8 kg)  09/19/16 230 lb (104.3 kg)     GEN:  Well nourished, well developed in no acute distress HEENT: Normal NECK: No JVD; No carotid bruits LYMPHATICS: No lymphadenopathy CARDIAC: RRR, no murmurs, rubs, gallops RESPIRATORY:  Clear to auscultation without rales, wheezing or rhonchi  ABDOMEN: Soft, non-tender, non-distended MUSCULOSKELETAL:  No edema; No deformity  SKIN: Warm and dry NEUROLOGIC:  Alert and oriented x 3 PSYCHIATRIC:  Normal affect   ASSESSMENT:    1. Palpitations   2. Nonspecific abnormal electrocardiogram (ECG) (EKG)   3. Essential hypertension    PLAN:    Palpitations: Description concerning for arrhythmia, will  evaluate with Zio patch x2 weeks.  Nonspecific EKG abnormalities, will check echocardiogram to rule out structural heart disease.  Hypertension: On hydrochlorothiazide 25 mg daily, olmesartan 40 mg daily, Toprol-XL 50 mg daily    RTC in 6 months   Medication Adjustments/Labs and Tests Ordered: Current medicines are reviewed at length with the patient today.  Concerns regarding medicines are outlined above.  Orders Placed This Encounter  Procedures   LONG TERM MONITOR (3-14 DAYS)   EKG 12-Lead   ECHOCARDIOGRAM COMPLETE   No orders of the defined types were placed in this encounter.   Patient Instructions  Medication Instructions:  No Changes In Medications at this time.   *If you need a refill on your cardiac medications before your next appointment, please call your pharmacy*  Testing/Procedures: Your physician has requested that you have an echocardiogram. Echocardiography is a painless test that uses sound waves to create images of your heart. It provides your doctor with information about the size and shape of your heart and how well your hearts chambers and valves are working. You may receive an ultrasound enhancing agent through an IV if needed to better visualize your heart during the echo.This procedure takes approximately one hour. There are no restrictions for this procedure. This will take place at the [address], [address].     ZIO XT- Long Term Monitor Instructions   Your physician has requested you wear your ZIO patch monitor___14____days.   This is a single patch monitor.  [employer] supplies one patch monitor per enrollment.  Additional stickers are not available.   Please do not apply patch if you will be having a Nuclear Stress Test, Echocardiogram, Cardiac CT, MRI, or Chest Xray during the time frame you would be wearing the monitor. The patch cannot be worn during these tests.  You cannot remove and re-apply the ZIO XT patch monitor.   Your ZIO patch  monitor will be sent USPS Priority mail from [facility] directly to your home address. The monitor may also be mailed to a PO BOX if home delivery is not available.   It may take 3-5 days to receive your monitor after you have been enrolled.   Once you have received you monitor, please review enclosed instructions.  Your monitor has already been registered assigning a specific monitor serial # to you.   Applying the monitor   Shave hair from upper left chest.   Hold abrader disc by orange tab.  Rub abrader in 40 strokes over left upper chest as indicated in your monitor instructions.   Clean area with 4 enclosed alcohol pads .  Use all pads to assure are is cleaned thoroughly.  Let dry.   Apply patch as indicated in monitor instructions.  Patch will be place under collarbone on left side of chest with arrow pointing upward.   Rub patch adhesive wings for 2 minutes.Remove white label marked "1".  Remove white label marked "2".  Rub patch adhesive wings for 2 additional minutes.   While looking in a mirror, press and release button in center of patch.  A small green light will flash 3-4 times .  This will be your only indicator the monitor has been turned on.     Do not shower for the first 24 hours.  You may shower after the first 24 hours.   Press button if you feel a symptom. You will hear a small click.  Record Date, Time and Symptom in the Patient Log Book.   When you are ready to remove patch, follow instructions on last 2 pages of Patient Log Book.  Stick patch monitor onto last page of Patient Log Book.   Place Patient Log Book in [place] box.  Use locking tab on box and tape box closed securely.  The Orange and [employer] has [employer] on it.  Please place in mailbox as soon as possible.  Your physician should have your test results approximately 7 days after the monitor has been mailed back to [facility] [facility]   Call [facility] Customer Care at [phone] if you  have questions regarding your ZIO XT patch monitor.  Call them immediately if you see an orange light blinking on your monitor.   If your monitor falls off in less than 4 days contact our Monitor department at [phone].  If your monitor becomes loose or falls off after 4 days call [employer] at [phone] for suggestions on securing your monitor.    Follow-Up: At [facility], you and your health needs are our priority.  As part of our continuing mission to provide you with exceptional heart care, we have created designated Provider Care Teams.  These Care Teams include your primary Cardiologist (physician) and Advanced Practice Providers (APPs -  Physician Assistants and Nurse Practitioners) who all work together to provide you with the care you need, when you need it.  We recommend signing up for the patient portal called "MyChart".  Sign up information is provided on this After Visit Summary.  MyChart is used to connect with patients for Virtual Visits (Telemedicine).  Patients are able to view lab/test results, encounter notes, upcoming appointments, etc.  Non-urgent messages can be sent to your provider as well.   To learn more about what you can do with MyChart, go to [email or web address].    Your next appointment:   6 month(s)  The format for your next appointment:   In Person  Provider:   Little Ishikawa, MD        Signed, Little Ishikawa, MD  10/12/2021 5:13 PM    [facility]

## 2021-10-12 ENCOUNTER — Other Ambulatory Visit: Payer: Self-pay

## 2021-10-12 ENCOUNTER — Ambulatory Visit (INDEPENDENT_AMBULATORY_CARE_PROVIDER_SITE_OTHER): Payer: Medicare Other

## 2021-10-12 ENCOUNTER — Ambulatory Visit (INDEPENDENT_AMBULATORY_CARE_PROVIDER_SITE_OTHER): Payer: Medicare Other | Admitting: Cardiology

## 2021-10-12 ENCOUNTER — Encounter: Payer: Self-pay | Admitting: Cardiology

## 2021-10-12 VITALS — BP 130/88 | HR 72 | Ht 74.0 in | Wt 220.6 lb

## 2021-10-12 DIAGNOSIS — R002 Palpitations: Secondary | ICD-10-CM | POA: Diagnosis not present

## 2021-10-12 DIAGNOSIS — I10 Essential (primary) hypertension: Secondary | ICD-10-CM

## 2021-10-12 DIAGNOSIS — R9431 Abnormal electrocardiogram [ECG] [EKG]: Secondary | ICD-10-CM

## 2021-10-12 NOTE — Patient Instructions (Addendum)
Medication Instructions:  No Changes In Medications at this time.   *If you need a refill on your cardiac medications before your next appointment, please call your pharmacy*  Testing/Procedures: Your physician has requested that you have an echocardiogram. Echocardiography is a painless test that uses sound waves to create images of your heart. It provides your doctor with information about the size and shape of your heart and how well your hearts chambers and valves are working. You may receive an ultrasound enhancing agent through an IV if needed to better visualize your heart during the echo.This procedure takes approximately one hour. There are no restrictions for this procedure. This will take place at the [address].     ZIO XT- Long Term Monitor Instructions   Your physician has requested you wear your ZIO patch monitor___14____days.   This is a single patch monitor.  Ir[employer] supplies one patch monitor per enrollment.  Additional stickers are not available.   Please do not apply patch if you will be having a Nuclear Stress Test, Echocardiogram, Cardiac CT, MRI, or Chest Xray during the time frame you would be wearing the monitor. The patch cannot be worn during these tests.  You cannot remove and re-apply the ZIO XT patch monitor.   Your ZIO patch monitor will be sent USPS Priority mail from [facility] directly to your home address. The monitor may also be mailed to a PO BOX if home delivery is not available.   It may take 3-5 days to receive your monitor after you have been enrolled.   Once you have received you monitor, please review enclosed instructions.  Your monitor has already been registered assigning a specific monitor serial # to you.   Applying the monitor   Shave hair from upper left chest.   Hold abrader disc by orange tab.  Rub abrader in 40 strokes over left upper chest as indicated in your monitor instructions.   Clean area with 4  enclosed alcohol pads .  Use all pads to assure are is cleaned thoroughly.  Let dry.   Apply patch as indicated in monitor instructions.  Patch will be place under collarbone on left side of chest with arrow pointing upward.   Rub patch adhesive wings for 2 minutes.Remove white label marked "1".  Remove white label marked "2".  Rub patch adhesive wings for 2 additional minutes.   While looking in a mirror, press and release button in center of patch.  A small green light will flash 3-4 times .  This will be your only indicator the monitor has been turned on.     Do not shower for the first 24 hours.  You may shower after the first 24 hours.   Press button if you feel a symptom. You will hear a small click.  Record Date, Time and Symptom in the Patient Log Book.   When you are ready to remove patch, follow instructions on last 2 pages of Patient Log Book.  Stick patch monitor onto last page of Patient Log Book.   Place Patient Log Book in [place] box.  Use locking tab on box and tape box closed securely.  The Orange and [employer] has [employer] on it.  Please place in mailbox as soon as possible.  Your physician should have your test results approximately 7 days after the monitor has been mailed back to [facility].   Call [place] at [phone] if you have questions  regarding your ZIO XT patch monitor.  Call them immediately if you see an orange light blinking on your monitor.   If your monitor falls off in less than 4 days contact our Monitor department at [phone].  If your monitor becomes loose or falls off after 4 days call [employer] at [phone] for suggestions on securing your monitor.    Follow-Up: At [facility], you and your health needs are our priority.  As part of our continuing mission to provide you with exceptional heart care, we have created designated Provider Care Teams.  These Care Teams include your primary Cardiologist (physician) and  Advanced Practice Providers (APPs -  Physician Assistants and Nurse Practitioners) who all work together to provide you with the care you need, when you need it.  We recommend signing up for the patient portal called "MyChart".  Sign up information is provided on this After Visit Summary.  MyChart is used to connect with patients for Virtual Visits (Telemedicine).  Patients are able to view lab/test results, encounter notes, upcoming appointments, etc.  Non-urgent messages can be sent to your provider as well.   To learn more about what you can do with MyChart, go to [email or web address].    Your next appointment:   6 month(s)  The format for your next appointment:   In Person  Provider:   Donato Heinz, MD

## 2021-10-12 NOTE — Progress Notes (Unsigned)
Enrolled patient for a 14 day Zio XT  monitor to be mailed to patients home  °

## 2021-10-16 DIAGNOSIS — R9431 Abnormal electrocardiogram [ECG] [EKG]: Secondary | ICD-10-CM

## 2021-10-16 DIAGNOSIS — R002 Palpitations: Secondary | ICD-10-CM

## 2021-10-31 ENCOUNTER — Other Ambulatory Visit: Payer: Self-pay

## 2021-10-31 ENCOUNTER — Ambulatory Visit (HOSPITAL_COMMUNITY): Payer: Medicare Other | Attending: Internal Medicine

## 2021-10-31 DIAGNOSIS — R9431 Abnormal electrocardiogram [ECG] [EKG]: Secondary | ICD-10-CM

## 2021-10-31 DIAGNOSIS — R002 Palpitations: Secondary | ICD-10-CM

## 2021-10-31 LAB — ECHOCARDIOGRAM COMPLETE
Area-P 1/2: 2.73 cm2
S' Lateral: 3.2 cm

## 2021-12-22 ENCOUNTER — Encounter: Payer: Self-pay | Admitting: *Deleted

## 2022-02-18 IMAGING — DX DG CHEST 1V PORT
1 series · 1 of 1 positions shown · non-contrast
Comparison: 09/19/2016

CLINICAL DATA: [id number] positive, fatigue

EXAM:
PORTABLE CHEST 1 VIEW

[chest ap]
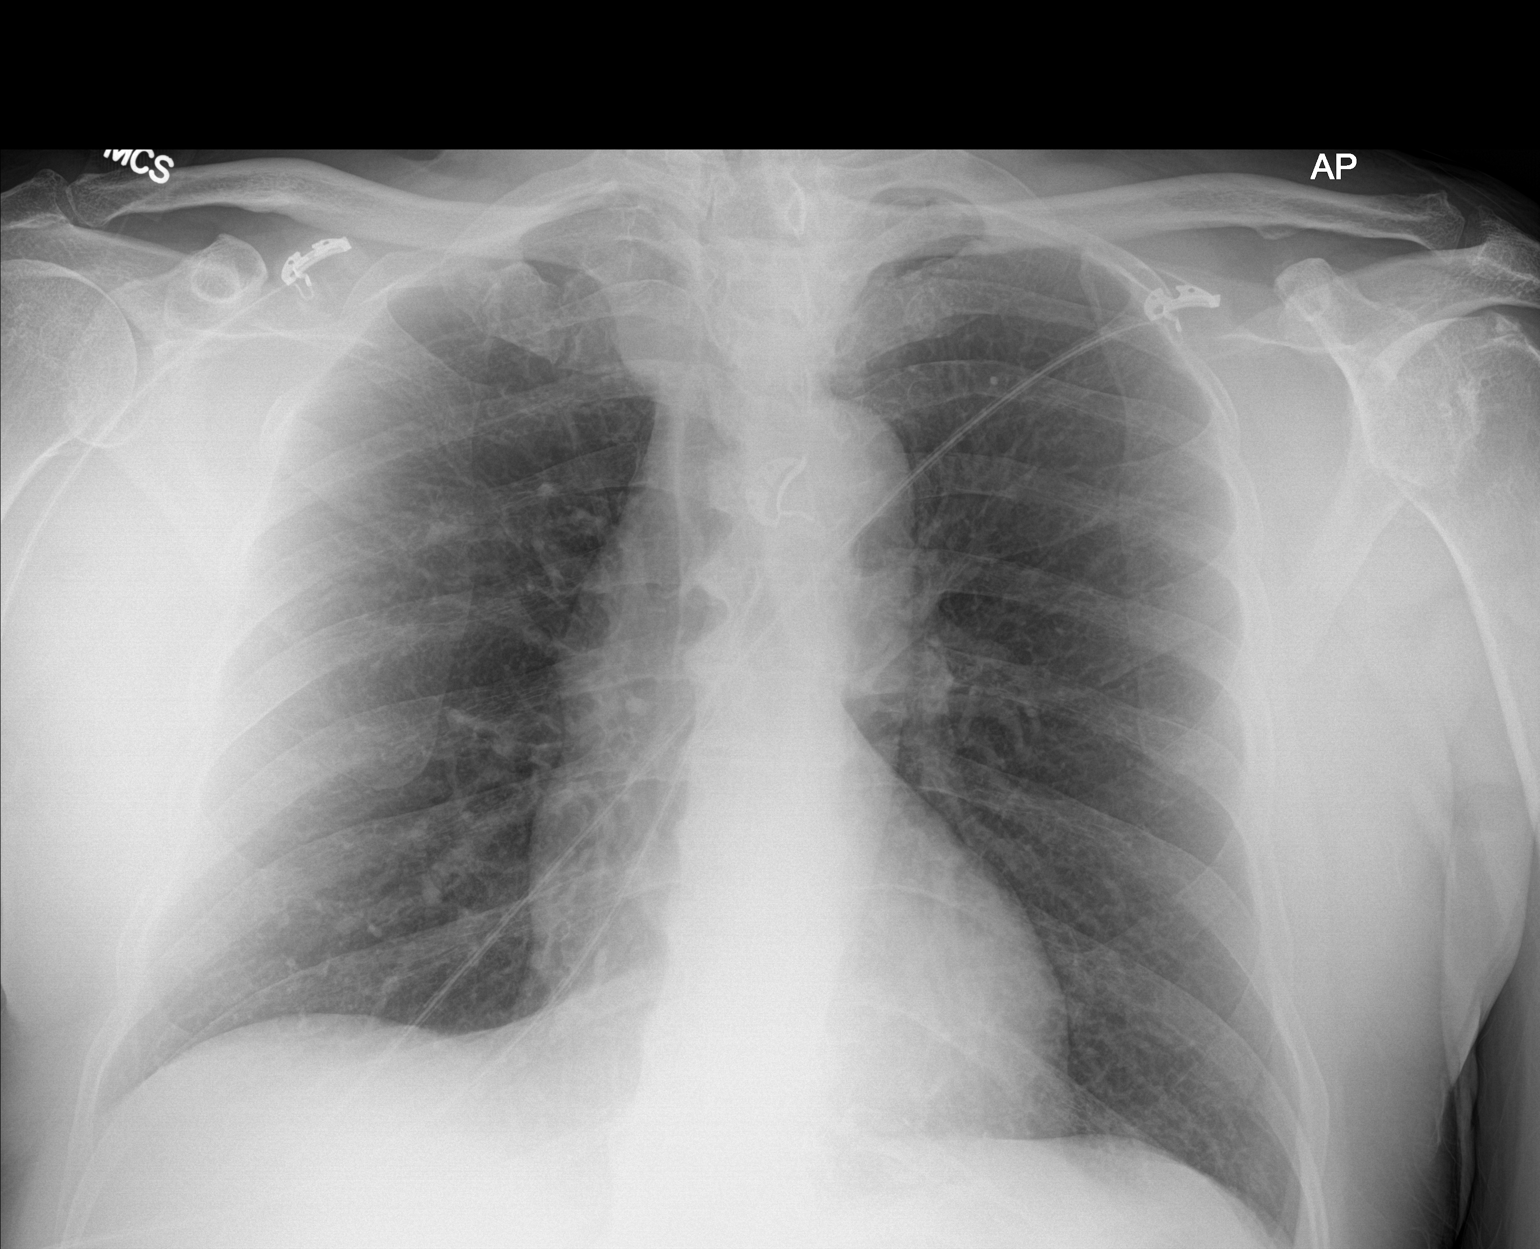

[1 of 1 positions shown; findings below may reference images not displayed]

FINDINGS: The heart size and mediastinal contours are within normal limits.
Both lungs are clear. The visualized skeletal structures are
unremarkable.
IMPRESSION: No active disease.

## 2023-12-12 ENCOUNTER — Emergency Department (HOSPITAL_BASED_OUTPATIENT_CLINIC_OR_DEPARTMENT_OTHER)

## 2023-12-12 ENCOUNTER — Other Ambulatory Visit: Payer: Self-pay

## 2023-12-12 ENCOUNTER — Emergency Department (HOSPITAL_BASED_OUTPATIENT_CLINIC_OR_DEPARTMENT_OTHER)
Admission: EM | Admit: 2023-12-12 | Discharge: 2023-12-13 | Disposition: A | Discharge status: Home / Self Care | Attending: Emergency Medicine | Admitting: Emergency Medicine

## 2023-12-12 ENCOUNTER — Encounter (HOSPITAL_BASED_OUTPATIENT_CLINIC_OR_DEPARTMENT_OTHER): Payer: Self-pay | Admitting: Emergency Medicine

## 2023-12-12 DIAGNOSIS — Z79899 Other long term (current) drug therapy: Secondary | ICD-10-CM | POA: Insufficient documentation

## 2023-12-12 DIAGNOSIS — W19XXXA Unspecified fall, initial encounter: Secondary | ICD-10-CM

## 2023-12-12 DIAGNOSIS — S0993XA Unspecified injury of face, initial encounter: Secondary | ICD-10-CM | POA: Diagnosis present

## 2023-12-12 DIAGNOSIS — R9082 White matter disease, unspecified: Secondary | ICD-10-CM | POA: Insufficient documentation

## 2023-12-12 DIAGNOSIS — W109XXA Fall (on) (from) unspecified stairs and steps, initial encounter: Secondary | ICD-10-CM | POA: Insufficient documentation

## 2023-12-12 DIAGNOSIS — S0081XA Abrasion of other part of head, initial encounter: Secondary | ICD-10-CM | POA: Insufficient documentation

## 2023-12-12 DIAGNOSIS — I10 Essential (primary) hypertension: Secondary | ICD-10-CM | POA: Diagnosis not present

## 2023-12-12 NOTE — ED Provider Notes (Signed)
 [facility] Provider Note   CSN: [id number] Arrival date & time: 12/12/23  1927     History {Add pertinent medical, surgical, social history, OB history to HPI:1} Chief Complaint  Patient presents with   Cody Evans is a 68 y.o. male.  The history is provided by the patient.  Fall  Cody Evans is a 68 y.o. male who presents to the Emergency Department complaining of *** Tripped and fell, hit forehead.  Around 3.  Missed a step and fell forward.  No loc, n/v, vision changes.  No pain.   Hx/o HTN     Home Medications Prior to Admission medications   Medication Sig Start Date End Date Taking? Authorizing Provider  brimonidine (ALPHAGAN) 0.2 % ophthalmic solution  01/19/16   [provider]  Doxepin HCl 5 % CREA  12/29/15   [provider]  hydrochlorothiazide (HYDRODIURIL) 25 MG tablet Take 25 mg by mouth daily. 09/14/21   [provider]  latanoprost (XALATAN) 0.005 % ophthalmic solution  12/26/15   [provider]  lidocaine (XYLOCAINE) 5 % ointment  01/31/16   [provider]  metoprolol succinate (TOPROL-XL) 50 MG 24 hr tablet Take 50 mg by mouth daily. 09/14/21   [provider]  olmesartan (BENICAR) 40 MG tablet Take 1 tablet by mouth daily. 04/04/17   [provider]      Allergies    Patient has no known allergies.    Review of Systems   Review of Systems  All other systems reviewed and are negative.   Physical Exam Updated Vital Signs BP (!) 177/98   Pulse (!) 55   Temp (!) 97.4 F (36.3 C)   Resp 16   Ht 6\' 2"  (1.88 m)   Wt 102.1 kg   SpO2 100%   BMI 28.89 kg/m  Physical Exam Vitals and nursing note reviewed.  Constitutional:      Appearance: He is well-developed.  HENT:     Head: Normocephalic.     Comments: Abrasion to central forehead and tip of nose.   Eyes:     Extraocular Movements: Extraocular movements intact.   Cardiovascular:     Rate and Rhythm: Normal rate and regular rhythm.     Heart sounds: No murmur heard. Pulmonary:     Effort: Pulmonary effort is normal. No respiratory distress.     Breath sounds: Normal breath sounds.  Abdominal:     Palpations: Abdomen is soft.     Tenderness: There is no abdominal tenderness. There is no guarding or rebound.  Musculoskeletal:        General: No tenderness.     Cervical back: Neck supple.  Skin:    General: Skin is warm and dry.  Neurological:     Mental Status: He is alert and oriented to person, place, and time.     Comments: 5/5 strength in all four extremities and sensation to light touch intact in all four extremities.    Psychiatric:        Behavior: Behavior normal.     ED Results / Procedures / Treatments   Labs (all labs ordered are listed, but only abnormal results are displayed) Labs Reviewed - No data to display  EKG None  Radiology CT Head Wo Contrast Result Date: 12/12/2023 CLINICAL DATA:  Head trauma. [name] forward on concrete steps. Hit face/head. EXAM: CT HEAD WITHOUT CONTRAST TECHNIQUE: Contiguous axial images were obtained from the base  of the skull through the vertex without intravenous contrast. RADIATION DOSE REDUCTION: This exam was performed according to the departmental dose-optimization program which includes automated exposure control, adjustment of the mA and/or kV according to patient size and/or use of iterative reconstruction technique. COMPARISON:  None Available. FINDINGS: Brain: No acute infarct, hemorrhage, or mass lesion is present. Deep brain nuclei are within normal limits. Periventricular white matter hypoattenuation is mildly advanced for age. The ventricles are of normal size. No significant extraaxial fluid collection is present. The brainstem and cerebellum are within normal limits. Vascular: Atherosclerotic calcifications are present within the cavernous internal carotid arteries bilaterally. No  hyperdense vessel is present. Skull: Calvarium is intact. No focal lytic or blastic lesions are present. No significant extracranial soft tissue lesion is present. Sinuses/Orbits: The paranasal sinuses and mastoid air cells are clear. A remote left orbital blowout fracture is present. The globes and orbits are otherwise within normal limits. IMPRESSION: 1. No acute intracranial abnormality. 2. Periventricular white matter hypoattenuation is mildly advanced for age. This likely reflects the sequela of chronic microvascular ischemia. 3. Remote left orbital blowout fracture. Electronically Signed   By: Audree Leas M.D.   On: 12/12/2023 20:44   CT Cervical Spine Wo Contrast Result Date: 12/12/2023 CLINICAL DATA:  Fall.  Trauma.  Hit head. EXAM: CT CERVICAL SPINE WITHOUT CONTRAST TECHNIQUE: Multidetector CT imaging of the cervical spine was performed without intravenous contrast. Multiplanar CT image reconstructions were also generated. RADIATION DOSE REDUCTION: This exam was performed according to the departmental dose-optimization program which includes automated exposure control, adjustment of the mA and/or kV according to patient size and/or use of iterative reconstruction technique. COMPARISON:  None Available. FINDINGS: Alignment: No significant listhesis is present. Straightening of the normal cervical lordosis is present. Skull base and vertebrae: Craniocervical junction is normal. Vertebral body heights are normal. No acute fractures are present. Soft tissues and spinal canal: No prevertebral fluid or swelling. No visible canal hematoma. Disc levels: Uncovertebral and facet hypertrophy leads to moderate right foraminal narrowing on the right at C3-4 and C5-6 and on the left at C6-7. No significant central canal stenosis is present. Upper chest: The lung apices are clear. The thoracic inlet is within normal limits. IMPRESSION: 1. No acute fracture or traumatic subluxation. 2. Moderate right foraminal  narrowing at C3-4 and C5-6 and on the left at C6-7. Electronically Signed   By: Audree Leas M.D.   On: 12/12/2023 20:39    Procedures Procedures  {Document cardiac monitor, telemetry assessment procedure when appropriate:1}  Medications Ordered in ED Medications - No data to display  ED Course/ Medical Decision Making/ A&P   {   Click here for ABCD2, HEART and other calculatorsREFRESH Note before signing :1}                              Medical Decision Making Amount and/or Complexity of Data Reviewed Radiology: ordered.   ***  {Document critical care time when appropriate:1} {Document review of labs and clinical decision tools ie heart score, Chads2Vasc2 etc:1}  {Document your independent review of radiology images, and any outside records:1} {Document your discussion with family members, caretakers, and with consultants:1} {Document social determinants of health affecting pt's care:1} {Document your decision making why or why not admission, treatments were needed:1} Final Clinical Impression(s) / ED Diagnoses Final diagnoses:  None    Rx / DC Orders ED Discharge Orders     None

## 2023-12-12 NOTE — ED Triage Notes (Signed)
 Pt reports he missed a step going up, fell onto concrete; mostly caught himself, but did hit head/face; abrasions noted to forehead and nose; BP elevate- pt notes he did not take BP meds for the last few days
